# Patient Record
Sex: Male | Born: 1965 | Race: White | Hispanic: No | Marital: Single | Smoking: Current every day smoker
Health system: Southern US, Community
[De-identification: ages and names within clinical notes are randomized; demographics above are authoritative.]

## PROBLEM LIST (undated history)

## (undated) HISTORY — PX: HERNIA REPAIR: SHX51

---

## 2013-11-12 ENCOUNTER — Ambulatory Visit: Payer: Self-pay | Admitting: Family Medicine

## 2013-11-12 DIAGNOSIS — Z0289 Encounter for other administrative examinations: Secondary | ICD-10-CM

## 2019-05-17 ENCOUNTER — Encounter (HOSPITAL_BASED_OUTPATIENT_CLINIC_OR_DEPARTMENT_OTHER): Payer: Self-pay | Admitting: *Deleted

## 2019-05-17 ENCOUNTER — Emergency Department (HOSPITAL_BASED_OUTPATIENT_CLINIC_OR_DEPARTMENT_OTHER): Payer: PRIVATE HEALTH INSURANCE

## 2019-05-17 ENCOUNTER — Other Ambulatory Visit: Payer: Self-pay

## 2019-05-17 ENCOUNTER — Emergency Department (HOSPITAL_BASED_OUTPATIENT_CLINIC_OR_DEPARTMENT_OTHER)
Admission: EM | Admit: 2019-05-17 | Discharge: 2019-05-17 | Disposition: A | Discharge status: Home / Self Care | Payer: PRIVATE HEALTH INSURANCE | Attending: Emergency Medicine | Admitting: Emergency Medicine

## 2019-05-17 DIAGNOSIS — F17200 Nicotine dependence, unspecified, uncomplicated: Secondary | ICD-10-CM | POA: Diagnosis not present

## 2019-05-17 DIAGNOSIS — I2699 Other pulmonary embolism without acute cor pulmonale: Secondary | ICD-10-CM | POA: Diagnosis not present

## 2019-05-17 DIAGNOSIS — Z20828 Contact with and (suspected) exposure to other viral communicable diseases: Secondary | ICD-10-CM | POA: Insufficient documentation

## 2019-05-17 DIAGNOSIS — I824Y2 Acute embolism and thrombosis of unspecified deep veins of left proximal lower extremity: Secondary | ICD-10-CM | POA: Diagnosis not present

## 2019-05-17 DIAGNOSIS — R0602 Shortness of breath: Secondary | ICD-10-CM | POA: Diagnosis present

## 2019-05-17 LAB — CBC WITH DIFFERENTIAL/PLATELET
Abs Immature Granulocytes: 0.03 10*3/uL (ref 0.00–0.07)
Basophils Absolute: 0 10*3/uL (ref 0.0–0.1)
Basophils Relative: 0 %
Eosinophils Absolute: 0.2 10*3/uL (ref 0.0–0.5)
Eosinophils Relative: 2 %
HCT: 42.2 % (ref 39.0–52.0)
Hemoglobin: 14.3 g/dL (ref 13.0–17.0)
Immature Granulocytes: 0 %
Lymphocytes Relative: 18 %
Lymphs Abs: 1.5 10*3/uL (ref 0.7–4.0)
MCH: 30.6 pg (ref 26.0–34.0)
MCHC: 33.9 g/dL (ref 30.0–36.0)
MCV: 90.2 fL (ref 80.0–100.0)
Monocytes Absolute: 0.7 10*3/uL (ref 0.1–1.0)
Monocytes Relative: 9 %
Neutro Abs: 6 10*3/uL (ref 1.7–7.7)
Neutrophils Relative %: 71 %
Platelets: 238 10*3/uL (ref 150–400)
RBC: 4.68 MIL/uL (ref 4.22–5.81)
RDW: 12.3 % (ref 11.5–15.5)
WBC: 8.4 10*3/uL (ref 4.0–10.5)
nRBC: 0 % (ref 0.0–0.2)

## 2019-05-17 LAB — COMPREHENSIVE METABOLIC PANEL
ALT: 26 U/L (ref 0–44)
AST: 18 U/L (ref 15–41)
Albumin: 3.8 g/dL (ref 3.5–5.0)
Alkaline Phosphatase: 50 U/L (ref 38–126)
Anion gap: 8 (ref 5–15)
BUN: 20 mg/dL (ref 6–20)
CO2: 23 mmol/L (ref 22–32)
Calcium: 9 mg/dL (ref 8.9–10.3)
Chloride: 107 mmol/L (ref 98–111)
Creatinine, Ser: 0.76 mg/dL (ref 0.61–1.24)
GFR calc Af Amer: >60 mL/min (ref >60)
GFR calc non Af Amer: >60 mL/min (ref >60)
Glucose, Bld: 101 mg/dL — ABNORMAL HIGH (ref 70–99)
Potassium: 4.2 mmol/L (ref 3.5–5.1)
Sodium: 138 mmol/L (ref 135–145)
Total Bilirubin: 0.9 mg/dL (ref 0.3–1.2)
Total Protein: 6.8 g/dL (ref 6.5–8.1)

## 2019-05-17 LAB — BRAIN NATRIURETIC PEPTIDE: B Natriuretic Peptide: 44.3 pg/mL (ref 0.0–100.0)

## 2019-05-17 LAB — TROPONIN I (HIGH SENSITIVITY)
Troponin I (High Sensitivity): <2 ng/L (ref <18)
Troponin I (High Sensitivity): <2 ng/L (ref <18)

## 2019-05-17 LAB — SARS CORONAVIRUS 2 (TAT 6-24 HRS): SARS Coronavirus 2: NEGATIVE

## 2019-05-17 MED ORDER — IOHEXOL 350 MG/ML SOLN
100.0000 mL | Freq: Once | INTRAVENOUS | Status: AC | PRN
  Administered 2019-05-17: 100 mL via INTRAVENOUS

## 2019-05-17 MED ORDER — RIVAROXABAN (XARELTO) EDUCATION KIT FOR DVT/PE PATIENTS
PACK | Freq: Once | Status: AC
  Administered 2019-05-17: 15:00:00

## 2019-05-17 MED ORDER — RIVAROXABAN (XARELTO) VTE STARTER PACK (15 & 20 MG): ORAL_TABLET | ORAL | 0 refills | Status: AC

## 2019-05-17 MED ORDER — RIVAROXABAN 15 MG PO TABS
15.0000 mg | ORAL_TABLET | Freq: Once | ORAL | Status: AC
  Administered 2019-05-17: 15 mg via ORAL
  Filled 2019-05-17: qty 1

## 2019-05-17 MED ORDER — RIVAROXABAN 15 MG PO TABS: 15.0000 mg | ORAL_TABLET | Freq: Two times a day (BID) | ORAL | Status: DC

## 2019-05-17 MED FILL — XARELTO STARTER PACK: 15 & 20 | 30 days supply | Qty: 51 | Fill #0

## 2019-05-17 NOTE — ED Notes (Signed)
ED Provider at bedside. 

## 2019-05-17 NOTE — ED Notes (Signed)
Pt on monitor 

## 2019-05-17 NOTE — ED Triage Notes (Signed)
Sob x 2 days. He noticed his left lower leg is very swollen today. No hx of PE or DVT.

## 2019-05-17 NOTE — ED Provider Notes (Signed)
[name] Provider Note   CSN: [id number] Arrival date & time: 05/17/19  1122     History Chief Complaint  Patient presents with  . Shortness of Breath    Anthony Gallagher is a 53 y.o. male.  53 year old male who presents with shortness of breath and leg swelling.  Patient notes that over the past few days, he has had persistent shortness of breath that is not associated with exertion.  He denies any associated chest pain.  He has had mild cough for a few days but also notes that he is a smoker.  No associated sore throat, fever, vomiting, or diarrhea no sick contacts.  He also reports that for the past few days he has noticed progressively worsening swelling of his left lower leg.  No preceding trauma or injury.  He has tried to keep his leg elevated and stayed home from work 1 day because of the severe achy pain but his swelling has persisted.  He denies any recent travel, recent hospitalization, history of blood clots, or history of cancer.  The history is provided by the patient.  Shortness of Breath      History reviewed. No pertinent past medical history.  There are no problems to display for this patient.   Past Surgical History:  Procedure Laterality Date  . HERNIA REPAIR         No family history on file.  Social History   Tobacco Use  . Smoking status: Current Every Day Smoker  . Smokeless tobacco: Never Used  Substance Use Topics  . Alcohol use: Not Currently  . Drug use: Never    Home Medications Prior to Admission medications   Medication Sig Start Date End Date Taking? Authorizing Provider  Rivaroxaban 15 & 20 MG TBPK Follow package directions: Take one 28m tablet by mouth twice a day. On day 22, switch to one 224mtablet once a day. Take with food. 05/17/19   Anthony Gallagher, RaWenda OverlandMD    Allergies    Patient has no known allergies.  Review of Systems   Review of Systems  Respiratory: Positive for shortness of  breath.    All other systems reviewed and are negative except that which was mentioned in HPI  Physical Exam Updated Vital Signs BP 120/83   Pulse 64   Temp 98.7 F (37.1 C) (Oral)   Resp 20   Ht 6' (1.829 m)   Wt 97.5 kg   SpO2 97%   BMI 29.16 kg/m   Physical Exam Vitals and nursing note reviewed.  Constitutional:      General: He is not in acute distress.    Appearance: He is well-developed.  HENT:     Head: Normocephalic and atraumatic.  Eyes:     Conjunctiva/sclera: Conjunctivae normal.  Cardiovascular:     Rate and Rhythm: Normal rate and regular rhythm.     Pulses: Normal pulses.     Heart sounds: Normal heart sounds. No murmur.  Pulmonary:     Effort: Pulmonary effort is normal.     Breath sounds: Normal breath sounds.  Abdominal:     General: Bowel sounds are normal. There is no distension.     Palpations: Abdomen is soft.     Tenderness: There is no abdominal tenderness.  Musculoskeletal:     Cervical back: Neck supple.     Right lower leg: No edema.     Left lower leg: Edema present.     Comments: 2+ pitting  edema L lower leg compared to R, no erythema or skin changes  Skin:    General: Skin is warm and dry.  Neurological:     Mental Status: He is alert and oriented to person, place, and time.     Comments: Fluent speech  Psychiatric:        Mood and Affect: Mood normal.        Judgment: Judgment normal.     ED Results / Procedures / Treatments   Labs (all labs ordered are listed, but only abnormal results are displayed) Labs Reviewed  COMPREHENSIVE METABOLIC PANEL - Abnormal; Notable for the following components:      Result Value   Glucose, Bld 101 (*)    All other components within normal limits  SARS CORONAVIRUS 2 (TAT 6-24 HRS)  BRAIN NATRIURETIC PEPTIDE  CBC WITH DIFFERENTIAL/PLATELET  TROPONIN I (HIGH SENSITIVITY)  TROPONIN I (HIGH SENSITIVITY)    EKG EKG Interpretation  Date/Time:  Monday May 17 2019 11:38:49  EST Ventricular Rate:  78 PR Interval:    QRS Duration: 84 QT Interval:  357 QTC Calculation: 407 R Axis:   42 Text Interpretation: Sinus rhythm No previous ECGs available Confirmed by Theotis Burrow [phone]) on 05/17/2019 12:37:11 PM   Radiology DG Chest Port 1 View  Result Date: 05/17/2019 CLINICAL DATA:  Shortness of breath for the past 2 days. Left lower leg swelling today. EXAM: PORTABLE CHEST 1 VIEW COMPARISON:  None. FINDINGS: The heart size and mediastinal contours are within normal limits. Both lungs are clear. The visualized skeletal structures are unremarkable. IMPRESSION: Normal examination. Electronically Signed   By: Claudie Revering M.D.   On: 05/17/2019 12:06    Procedures .Critical Care Performed by: Sharlett Iles, MD Authorized by: Sharlett Iles, MD   Critical care provider statement:    Critical care time (minutes):  30   Critical care time was exclusive of:  Separately billable procedures and treating other patients   Critical care was necessary to treat or prevent imminent or life-threatening deterioration of the following conditions:  Respiratory failure   Critical care was time spent personally by me on the following activities:  Development of treatment plan with patient or surrogate, examination of patient, obtaining history from patient or surrogate, ordering and performing treatments and interventions, ordering and review of laboratory studies, ordering and review of radiographic studies and re-evaluation of patient's condition   (including critical care time)  Medications Ordered in ED Medications  Rivaroxaban (XARELTO) tablet 15 mg (has no administration in time range)  Rivaroxaban (XARELTO) tablet 15 mg (has no administration in time range)  iohexol (OMNIPAQUE) 350 MG/ML injection 100 mL (100 mLs Intravenous Contrast Given 05/17/19 1347)  rivaroxaban (XARELTO) Education Kit for DVT/PE patients ( Does not apply Given 05/17/19 1456)    ED  Course  I have reviewed the triage vital signs and the nursing notes.  Pertinent labs & imaging results that were available during my care of the patient were reviewed by me and considered in my medical decision making (see chart for details).    MDM Rules/Calculators/A&P                       Patient comfortable on room air, 100%, hypertensive, normal heart rate.  Left leg swelling noted without skin changes to suggest cellulitis.  Strong suspicion of DVT with possible PE therefore obtained ultrasound, CTA in addition to lab work.  All lab work unremarkable including normal BNP and  troponin.  Chest x-ray clear.  EKG without ischemic changes.  Will send COVID-19 test as it has been associated w/ increased clotting risk.  [place] + for L DVT. CTA + for small subsegmental PEs b/l with no evidence of R heart strain. Discussed w/ radiologist.  I discussed results w/ patient and recommended anticoagulation. Pt denies h/o bleeding problems. Gave 1st dose of Xarelto and starter pack.  I had an extensive discussion with the patient regarding admission to the hospital for his diagnosis.  Ultimately, I recommended admission for monitoring while initiating anticoagulation as well as potential work-up of hypercoagulable state.  Patient voiced concerns of not wanting to be away from his pet at home and having no other family members to help him in the area.  I reviewed risks of foregoing admission and he voiced understanding.  He has normal vital signs here, oxygen level has been 100% on room air with no tachycardia, and given no evidence of heart strain or hypoxia, I agreed w/ plan for discharge as pt has reassured me that he will immediately f/u with his family medicine clinic ASAP for further workup. He understands need for strict medication compliance.  I discussed supportive measures for leg including frequent elevation and compression stocking.  I extensively reviewed return precautions with him and he voiced  understanding.  Anthony Gallagher was evaluated in Emergency Department on 05/17/2019 for the symptoms described in the history of present illness. He was evaluated in the context of the global COVID-19 pandemic, which necessitated consideration that the patient might be at risk for infection with the SARS-CoV-2 virus that causes COVID-19. Institutional protocols and algorithms that pertain to the evaluation of patients at risk for COVID-19 are in a state of rapid change based on information released by regulatory bodies including the CDC and federal and state organizations. These policies and algorithms were followed during the patient's care in the ED.   Final Clinical Impression(s) / ED Diagnoses Final diagnoses:  PE (pulmonary thromboembolism) ([place])  Acute deep vein thrombosis (DVT) of proximal vein of left lower extremity ([place])    Rx / DC Orders ED Discharge Orders         Ordered    Rivaroxaban 15 & 20 MG TBPK     05/17/19 1503           Linnet Bottari, Wenda Overland, MD 05/17/19 1510

## 2019-05-17 NOTE — ED Notes (Signed)
Instructions for xarelto discussed.  Provided information packet.  Pt verbalizes good understanding.

## 2021-06-28 IMAGING — DX DG CHEST 1V PORT
1 series · 1 of 1 positions shown · non-contrast
Comparison: None.

CLINICAL DATA: Shortness of breath for the past 2 days. Left lower
leg swelling today.

EXAM:
PORTABLE CHEST 1 VIEW

[chest ap]
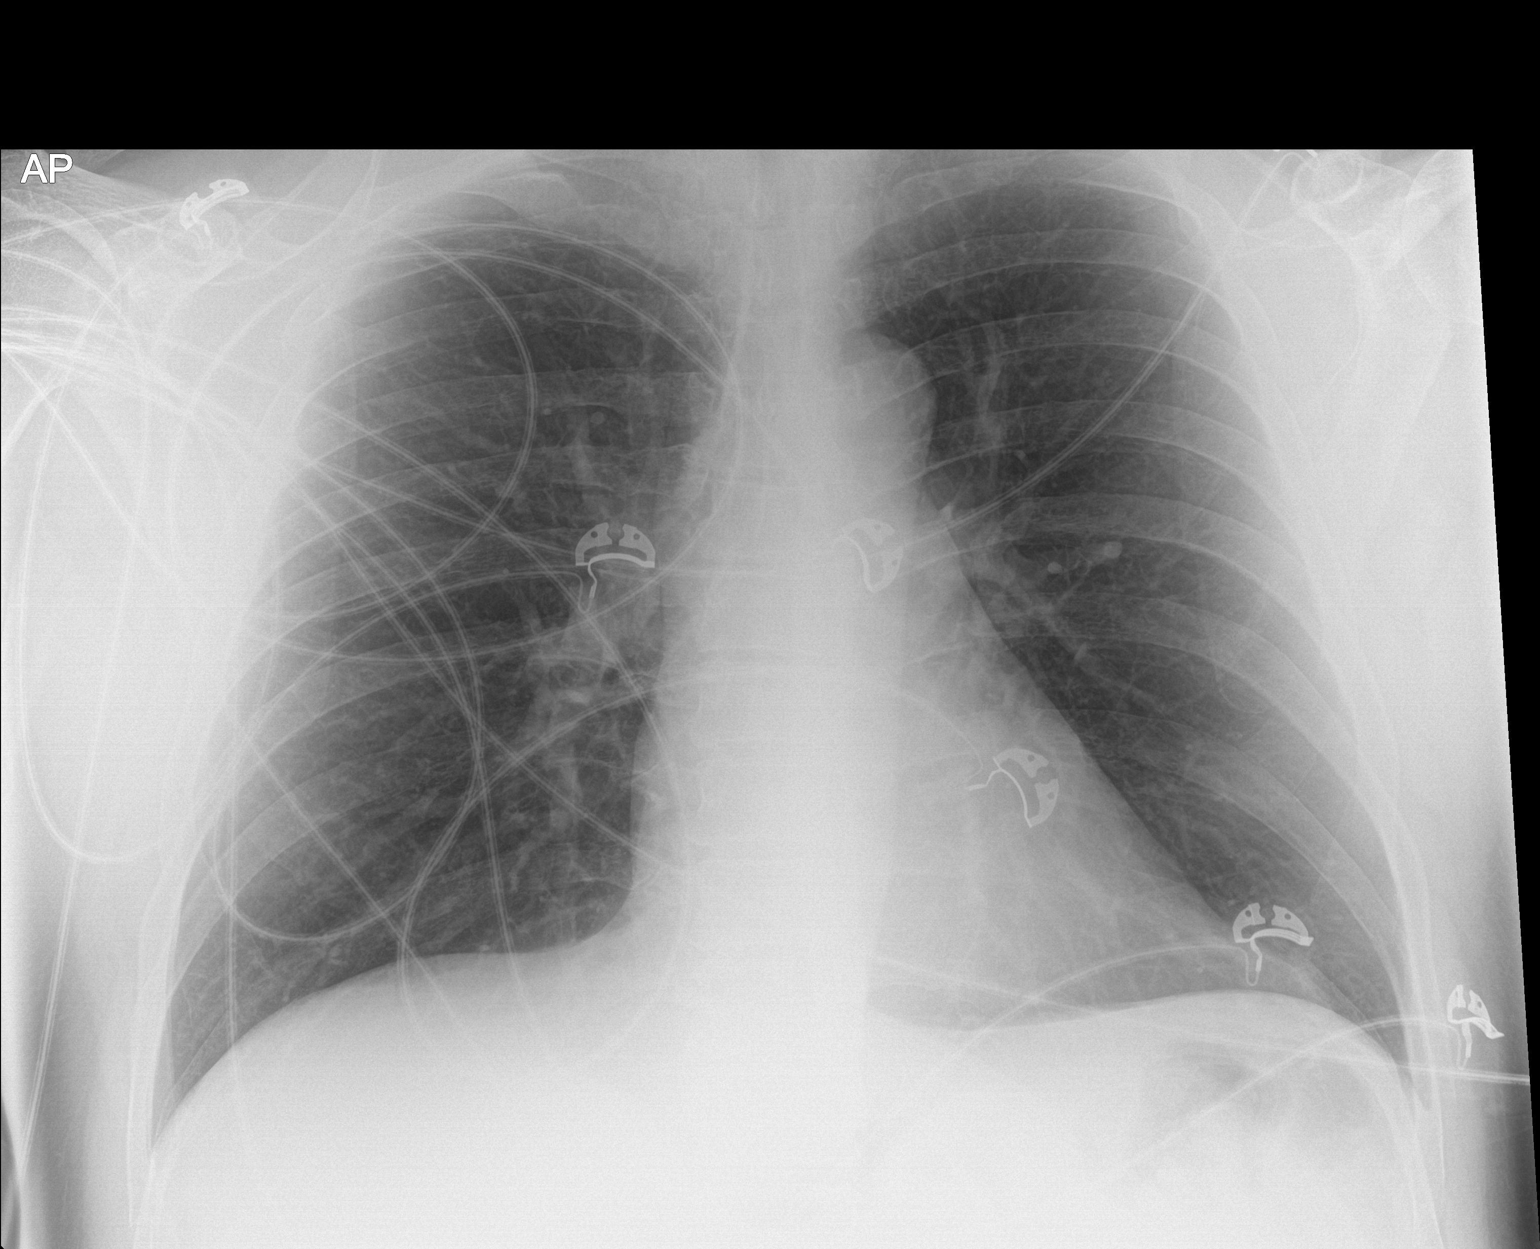

[1 of 1 positions shown; findings below may reference images not displayed]

FINDINGS: The heart size and mediastinal contours are within normal limits.
Both lungs are clear. The visualized skeletal structures are
unremarkable.
IMPRESSION: Normal examination.

## 2021-06-28 IMAGING — CT CT ANGIO CHEST
2 of 9 series · 19 of 36 positions shown · IV contrast (Omnipaque)
Comparison: None.

CLINICAL DATA: Shortness of breath

EXAM:
CT ANGIOGRAPHY CHEST WITH CONTRAST
TECHNIQUE: Multidetector CT imaging of the chest was performed using the
standard protocol during bolus administration of intravenous
contrast. Multiplanar CT image reconstructions and MIPs were
obtained to evaluate the vascular anatomy.
CONTRAST:  100mL OMNIPAQUE IOHEXOL 350 MG/ML SOLN

[Series 7: pe thins · axial · 0.80mm/px · z∈[-336,-47]mm · 18 of 323 slices shown]
[im 17/323  lung]
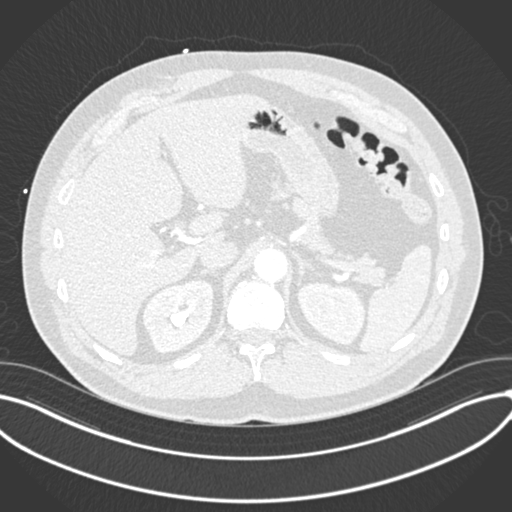
[im 34/323  mediastinal]
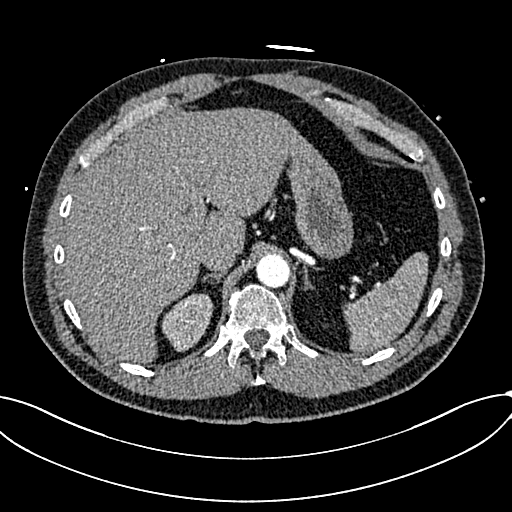
[im 51/323  lung]
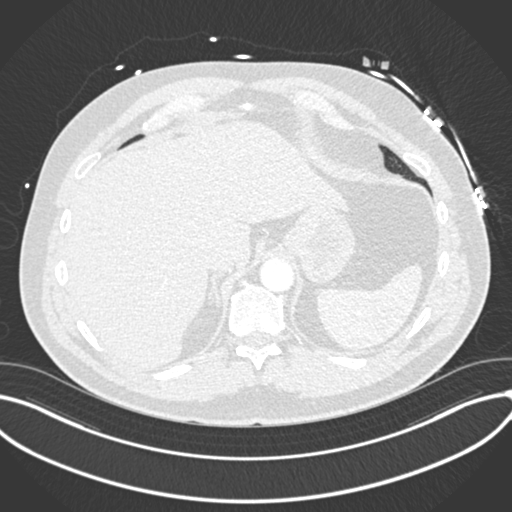
[im 68/323  mediastinal]
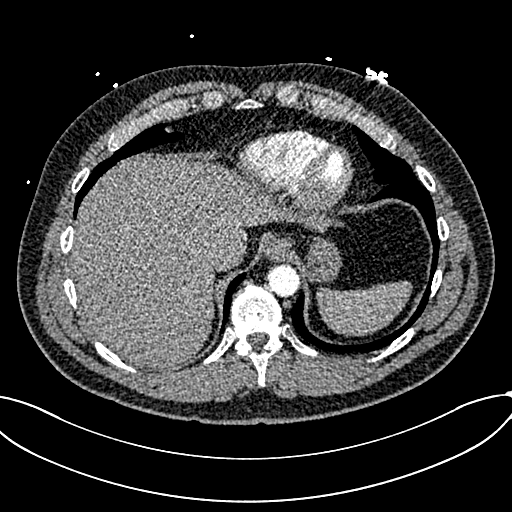
[im 85/323  lung]
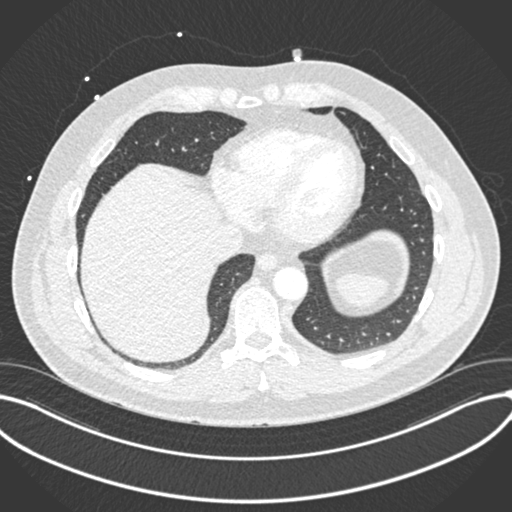
[im 102/323  mediastinal]
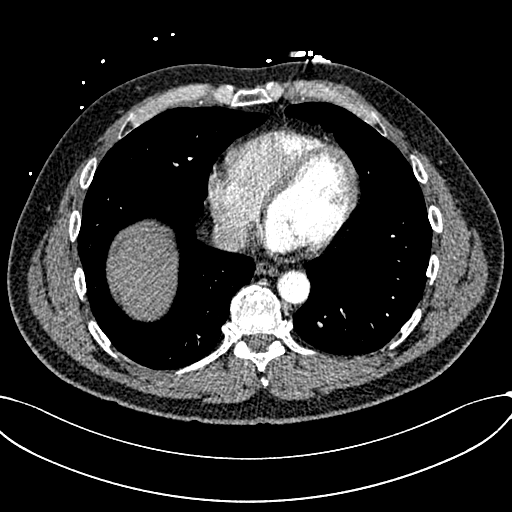
[im 119/323  lung]
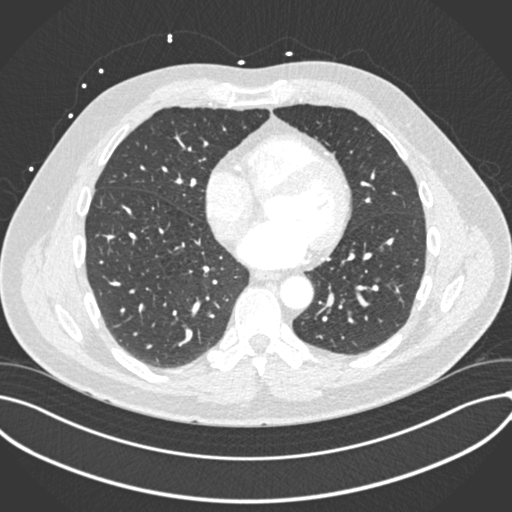
[im 136/323  mediastinal]
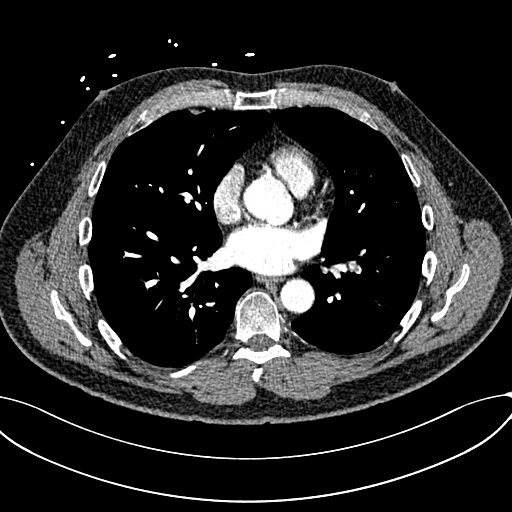
[im 153/323  lung]
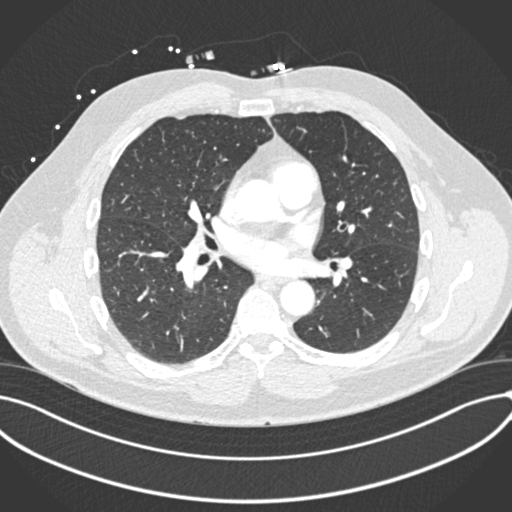
[im 170/323  mediastinal]
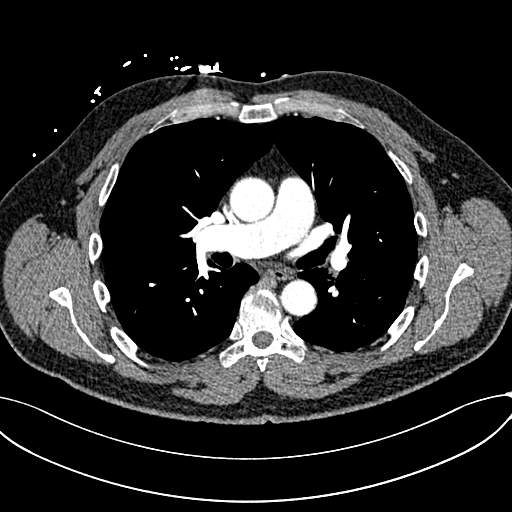
[im 187/323  lung]
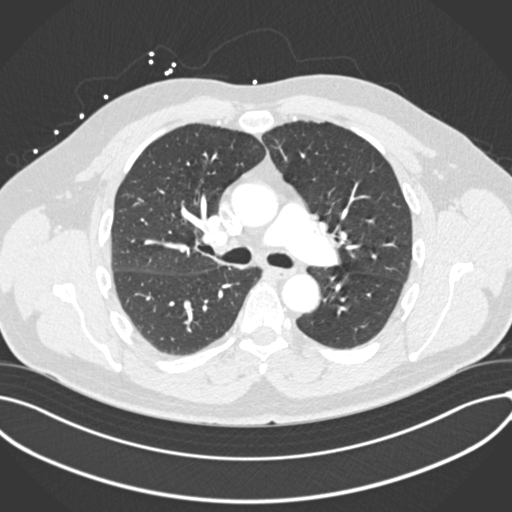
[im 204/323  mediastinal]
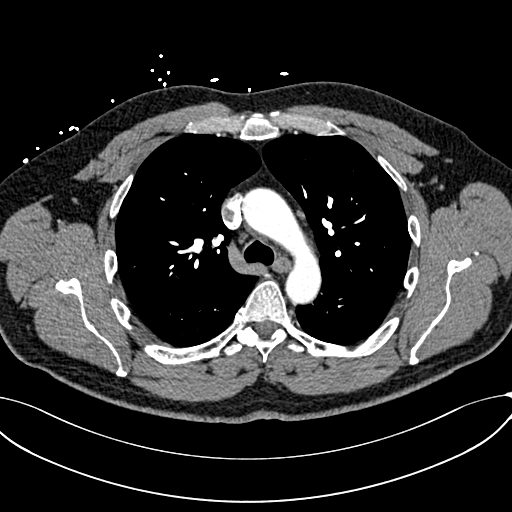
[im 221/323  lung]
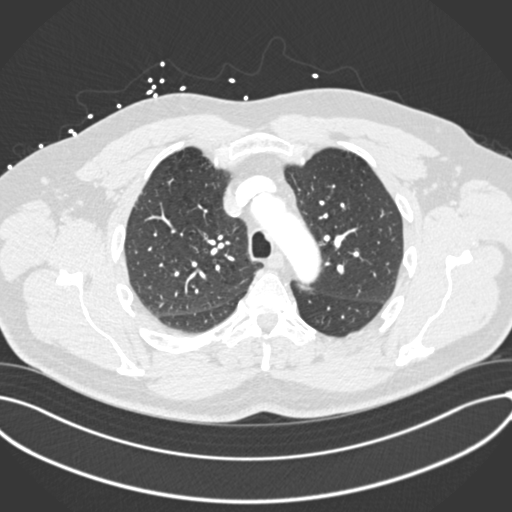
[im 238/323  mediastinal]
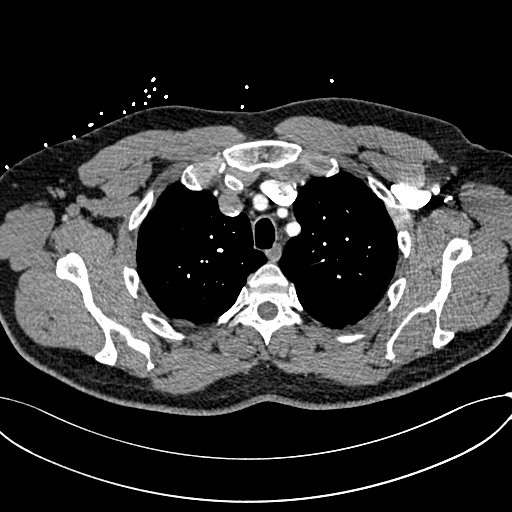
[im 255/323  lung]
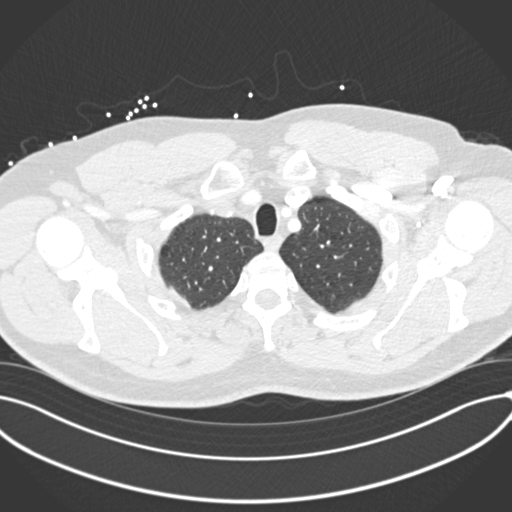
[im 272/323  mediastinal]
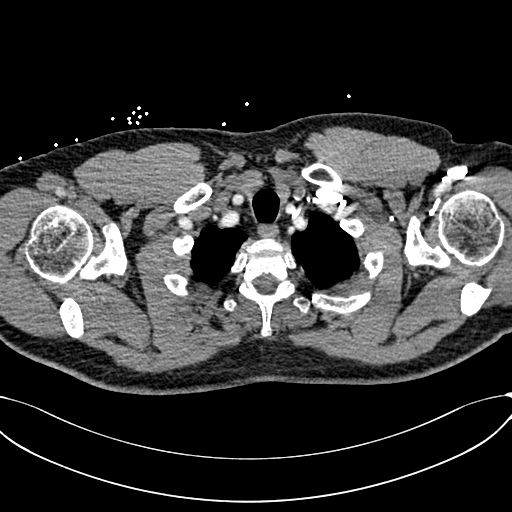
[im 289/323  lung]
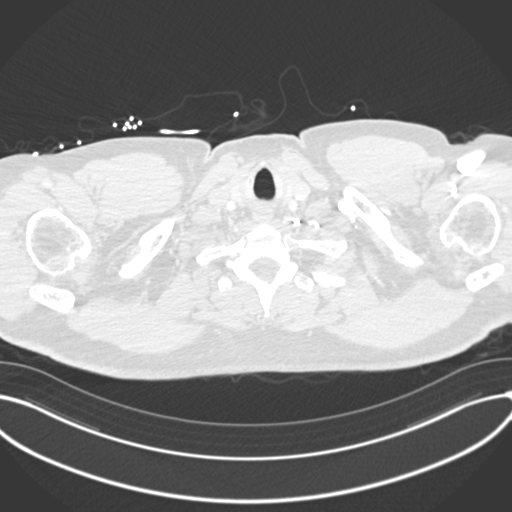
[im 306/323  mediastinal]
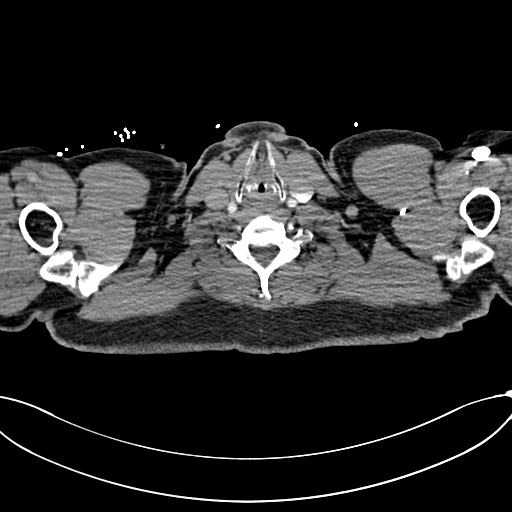

[Series 8: pe coronal mpr · coronal · 0.64mm/px · 1 of 142 slices shown]
[im 71/142  mediastinal]
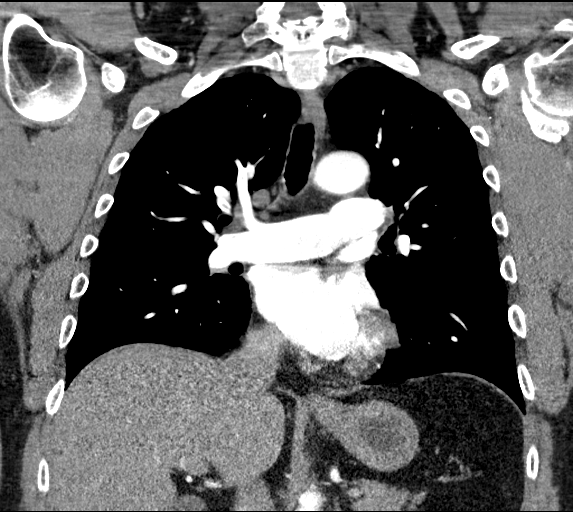

[19 of 36 positions shown; findings below may reference images not displayed]

FINDINGS: Cardiovascular: There are a few small subsegmental pulmonary emboli
in the lower lobes bilaterally. No evidence of right heart strain.
Heart is normal size. Aorta is normal caliber.

Mediastinum/Nodes: No mediastinal, hilar, or axillary adenopathy.
Trachea and esophagus are unremarkable. Thyroid unremarkable.

Lungs/Pleura: Lungs are clear. No focal airspace opacities or
suspicious nodules. No effusions.

Upper Abdomen: Imaging into the upper abdomen shows no acute
findings.

Musculoskeletal: Chest wall soft tissues are unremarkable. No acute
bony abnormality.

Review of the MIP images confirms the above findings.
IMPRESSION: Few small scattered subsegmental pulmonary emboli in the lower lobes
bilaterally. No evidence of right heart strain.

These results were called by telephone at the time of interpretation
on 05/17/2019 at [DATE] to provider ALEIGHA ANTILLON , who verbally
acknowledged these results.
